# Patient Record
Sex: Female | Born: 2000 | Race: Black or African American | Hispanic: No | Marital: Single | Smoking: Never smoker
Health system: Southern US, Community
[De-identification: ages and names within clinical notes are randomized; demographics above are authoritative.]

## PROBLEM LIST (undated history)

## (undated) DIAGNOSIS — J358 Other chronic diseases of tonsils and adenoids: Secondary | ICD-10-CM

## (undated) DIAGNOSIS — J309 Allergic rhinitis, unspecified: Secondary | ICD-10-CM

## (undated) HISTORY — PX: DENTAL SURGERY: SHX609

---
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
## 2021-02-22 ENCOUNTER — Other Ambulatory Visit: Payer: Self-pay | Admitting: Certified Nurse Midwife

## 2021-02-23 ENCOUNTER — Other Ambulatory Visit: Payer: Self-pay | Admitting: Certified Nurse Midwife

## 2021-02-23 DIAGNOSIS — N63 Unspecified lump in unspecified breast: Secondary | ICD-10-CM

## 2021-03-08 ENCOUNTER — Other Ambulatory Visit: Payer: Self-pay

## 2021-03-08 ENCOUNTER — Emergency Department
Admission: EM | Admit: 2021-03-08 | Discharge: 2021-03-08 | Disposition: A | Discharge status: Home / Self Care | Payer: Medicaid Other | Attending: Emergency Medicine | Admitting: Emergency Medicine

## 2021-03-08 DIAGNOSIS — H5789 Other specified disorders of eye and adnexa: Secondary | ICD-10-CM | POA: Diagnosis present

## 2021-03-08 DIAGNOSIS — H1133 Conjunctival hemorrhage, bilateral: Secondary | ICD-10-CM | POA: Insufficient documentation

## 2021-03-08 LAB — POC URINE PREG, ED: Preg Test, Ur: NEGATIVE

## 2021-03-08 NOTE — ED Triage Notes (Signed)
Pt comes with c/o bilateral eye pain. Pt states pain and redness to eyes. Pt unsure if she got something in her eyes.

## 2021-03-08 NOTE — ED Provider Notes (Signed)
[facility]  ____________________________________________   Event Date/Time   First MD Initiated Contact with Patient 03/08/21 1240     (approximate)  I have reviewed the triage vital signs and the nursing notes.   HISTORY  Chief Complaint Eye Pain    HPI Vanessa Mathews is a 20 y.o. female with no significant past medical history who presents with concern for bleeding in her eyes.  Patient woke up today and noticed that there was blood in the whites of her eyes.  Notes that she had 3 episodes of emesis yesterday.  Has never had this before.  She denies visual change, pain or floaters, flashers.  Does not wear contacts.  Denies any drainage.  No fevers.  Denies any bleeding gums or blood in her stool or other bleeding etc.         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 Authorizing Provider  co-enzyme Q-10 30 MG capsule Take 30 mg by mouth 3 (three) times daily.    [provider]  Cranberry 1000 MG CAPS Take by mouth.    [provider]  HYDROcodone-acetaminophen (HYCET) 7.5-325 mg/15 ml solution 10-15cc PO every 6 hours as needed for pain 08/06/19   Geanie Logan, MD  hydrOXYzine (ATARAX/VISTARIL) 25 MG tablet TAKE 1 TABLET (25 MG TOTAL) BY MOUTH AT BEDTIME AS NEEDED FOR ANXIETY. 02/23/20   Darcel Smalling, MD  magnesium 30 MG tablet Take 30 mg by mouth 2 (two) times daily.    [provider]  NORTREL 7/7/7  0.5/0.75/1-35 MG-MCG tablet TAKE 1 TAB BY MOUTH ONCE A DAY TO BE STARTED THIS SUNDAY 12/03/17   [provider]  sertraline (ZOLOFT) 25 MG tablet TAKE 1 TABLET BY MOUTH EVERY DAY 04/14/20   Jomarie Longs, MD    Allergies Nickel  Family History  Problem Relation Age of Onset   Liver cancer Maternal Grandmother    Diabetes Maternal Grandfather    Breast cancer Other     Social History Social History   Tobacco Use   Smoking status: Never   Smokeless tobacco: Never  Vaping Use   Vaping Use: Never used  Substance Use Topics   Alcohol use: No   Drug use: No    Review of Systems   Review of Systems  Constitutional:  Negative for fever.  Eyes:  Positive for redness. Negative for pain, discharge, itching and visual disturbance.  Hematological:  Does not bruise/bleed easily.  All other systems reviewed and are negative.  Physical Exam Updated Vital Signs BP 128/80 (BP Location: Left Arm)   Pulse 77   Temp 98.8 F (37.1 C) (Oral)   Resp 17   Ht 5\' 3"  (1.6 m)   Wt 76.7 kg   SpO2 97%   BMI 29.95 kg/m   Physical Exam Vitals and nursing note reviewed.  Constitutional:      General: She is not in acute distress.    Appearance:  Normal appearance.  HENT:     Head: Normocephalic and atraumatic.  Eyes:     General: No scleral icterus.    Extraocular Movements: Extraocular movements intact.     Pupils: Pupils are equal, round, and reactive to light.     Comments: BL subconjunctival hemorrhage, limbic sparing, no hyphema, EOMI   Pulmonary:     Effort: Pulmonary effort is normal. No respiratory distress.     Breath sounds: No stridor.  Musculoskeletal:        General: No deformity or signs of injury.     Cervical back: Normal range of motion.  Skin:    General: Skin is dry.     Coloration: Skin is not jaundiced or pale.  Neurological:     General: No focal deficit present.     Mental Status: She is alert and oriented to person, place, and time. Mental status is  at baseline.  Psychiatric:        Mood and Affect: Mood normal.        Behavior: Behavior normal.     LABS (all labs ordered are listed, but only abnormal results are displayed)  Labs Reviewed  POC URINE PREG, ED  POC URINE PREG, ED   ____________________________________________  EKG  N/a ____________________________________________  RADIOLOGY Ky Barban, personally viewed and evaluated these images (plain radiographs) as part of my medical decision making, as well as reviewing the written report by the radiologist.  ED MD interpretation:  n/a    ____________________________________________   PROCEDURES  Procedure(s) performed (including Critical Care):  Procedures   ____________________________________________   INITIAL IMPRESSION / ASSESSMENT AND PLAN / ED COURSE   20 year old female who presents with bilateral cell conjunctival hemorrhage in the setting of emesis yesterday.  Exam notable for minimal subconjunctival hemorrhage bilaterally, no other abnormalities.  Will obtain urine  preg in the emesis.  She has no ongoing GI upset or emesis today.  Otherwise stable for discharge.   hCG is negative.     ____________________________________________   FINAL CLINICAL IMPRESSION(S) / ED DIAGNOSES  Final diagnoses:  Subconjunctival hemorrhage of both eyes     ED Discharge Orders     None        Note:  This document was prepared using Dragon voice recognition software and may include unintentional dictation errors.    Georga Hacking, MD 03/08/21 [phone]

## 2021-03-08 NOTE — ED Notes (Signed)
See triage note  Presents with irritation to both eyes  Right is worse

## 2021-05-20 ENCOUNTER — Ambulatory Visit
Admission: RE | Admit: 2021-05-20 | Discharge: 2021-05-20 | Disposition: A | Discharge status: Home / Self Care | Payer: Medicaid Other | Source: Ambulatory Visit | Attending: Certified Nurse Midwife | Admitting: Certified Nurse Midwife

## 2021-05-20 ENCOUNTER — Other Ambulatory Visit: Payer: Self-pay

## 2021-05-20 DIAGNOSIS — N63 Unspecified lump in unspecified breast: Secondary | ICD-10-CM

## 2022-07-04 IMAGING — US US BREAST*L* LIMITED INC AXILLA
1 series · 3 of 3 positions shown · non-contrast
Comparison: Previous exam(s).

CLINICAL DATA: Patient presents for diffuse bilateral breast pain
since the age of 15 which is intermittent. Patient states there is
more focal pain within the upper-outer left breast as well as
palpable nodularity.

EXAM:
ULTRASOUND OF THE LEFT BREAST

[Series 1: us breast*left* limited inc axilla · 0.06mm/px · 3 of 3 slices shown]
[im 1/3]
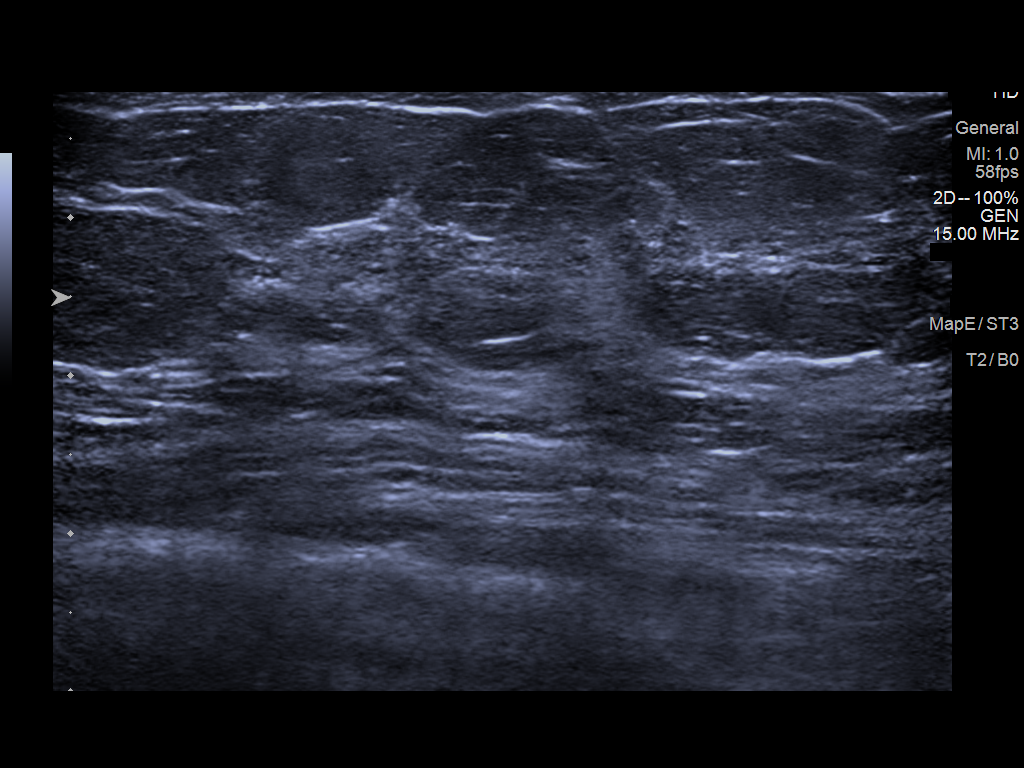
[im 2/3]
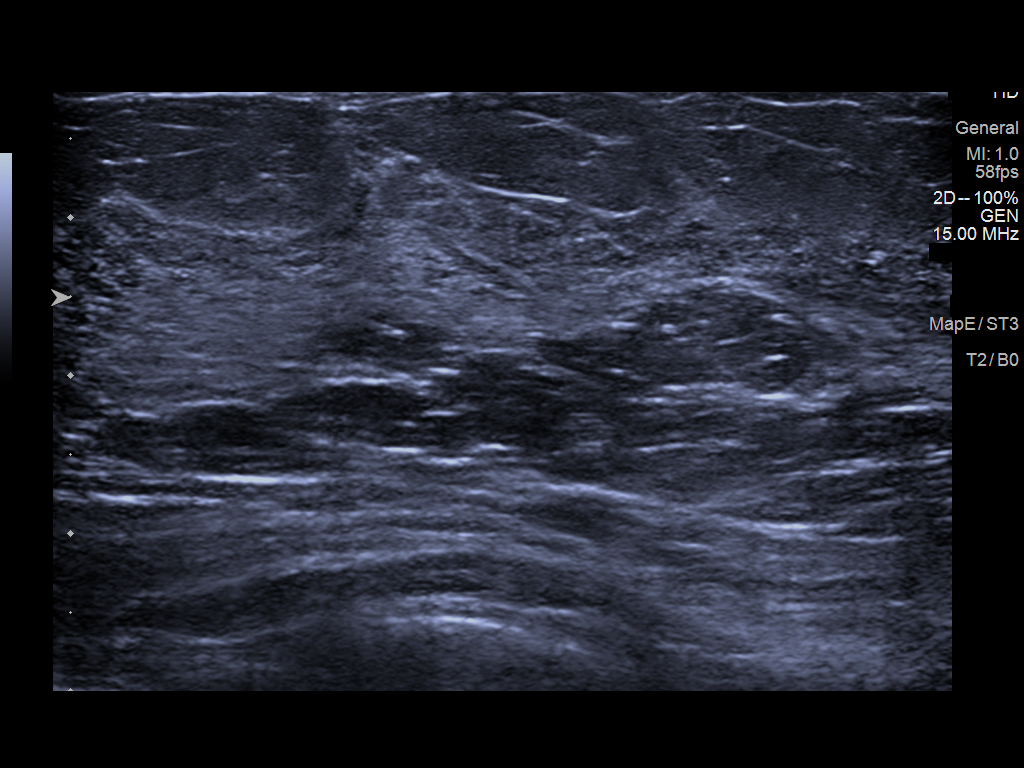
[im 3/3]
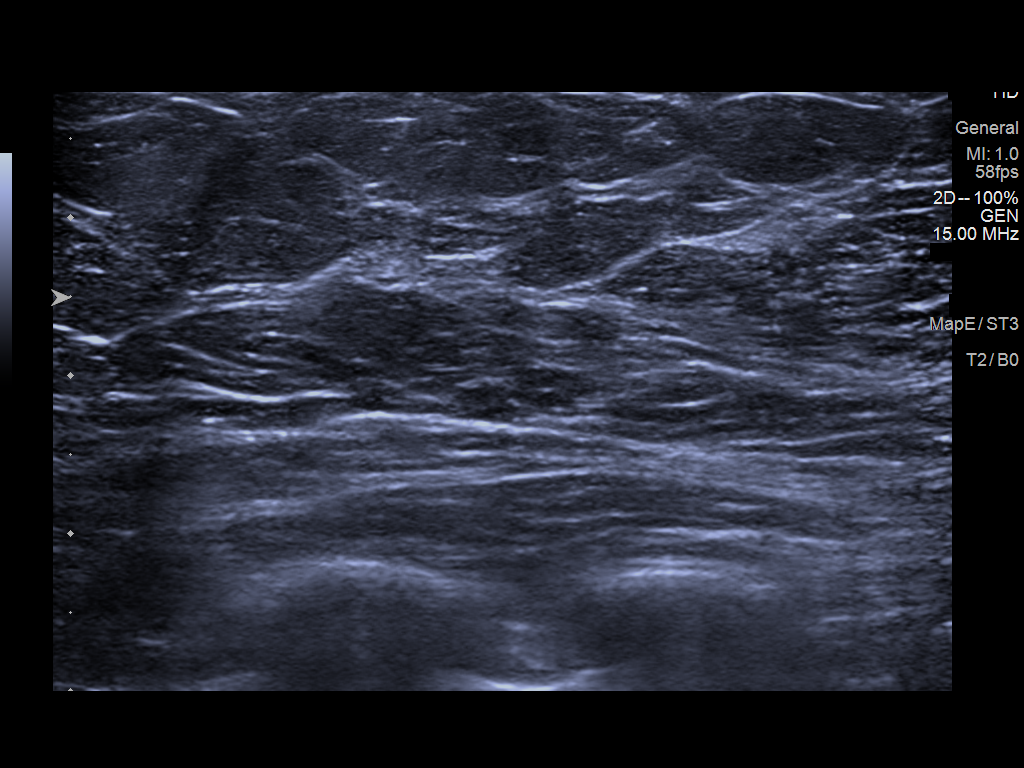

[3 of 3 positions shown; findings below may reference images not displayed]

FINDINGS: On physical exam, dense tissue is palpated within the upper-outer
left breast.

Targeted ultrasound is performed, showing normal tissue without
suspicious mass within the upper-outer left breast at the site of
focal tenderness and palpable nodularity.
IMPRESSION: No suspicious abnormality within the upper-outer left breast at the
site of focal tenderness and palpable nodularity.

RECOMMENDATION:
Continued clinical evaluation for pain within the upper-outer left
breast and palpable nodularity.

Screening mammogram at age 41 unless there are persistent or
intervening clinical concerns. (Code:K8-9-FQT)

I have discussed the findings and recommendations with the patient.
If applicable, a reminder letter will be sent to the patient
regarding the next appointment.

BI-RADS CATEGORY  1: Negative.

## 2022-08-22 ENCOUNTER — Other Ambulatory Visit
Admission: RE | Admit: 2022-08-22 | Discharge: 2022-08-22 | Disposition: A | Discharge status: Home / Self Care | Payer: Medicaid Other | Source: Ambulatory Visit | Attending: Student | Admitting: Student

## 2022-08-22 DIAGNOSIS — R1084 Generalized abdominal pain: Secondary | ICD-10-CM | POA: Diagnosis present

## 2022-08-22 LAB — LIPASE, BLOOD: Lipase: 37 U/L (ref 11–51)

## 2022-09-27 ENCOUNTER — Other Ambulatory Visit: Payer: Self-pay | Admitting: Student

## 2022-09-27 DIAGNOSIS — H538 Other visual disturbances: Secondary | ICD-10-CM

## 2022-09-27 DIAGNOSIS — R42 Dizziness and giddiness: Secondary | ICD-10-CM

## 2022-09-27 DIAGNOSIS — R519 Headache, unspecified: Secondary | ICD-10-CM

## 2022-10-04 ENCOUNTER — Ambulatory Visit
Admission: RE | Admit: 2022-10-04 | Discharge: 2022-10-04 | Disposition: A | Discharge status: Home / Self Care | Payer: Medicaid Other | Source: Ambulatory Visit | Attending: Student | Admitting: Student

## 2022-10-04 DIAGNOSIS — H538 Other visual disturbances: Secondary | ICD-10-CM | POA: Insufficient documentation

## 2022-10-04 DIAGNOSIS — R519 Headache, unspecified: Secondary | ICD-10-CM | POA: Diagnosis not present

## 2022-10-04 DIAGNOSIS — R42 Dizziness and giddiness: Secondary | ICD-10-CM | POA: Insufficient documentation

## 2023-05-28 ENCOUNTER — Emergency Department
Admission: EM | Admit: 2023-05-28 | Discharge: 2023-05-28 | Disposition: A | Discharge status: Home / Self Care | Payer: Medicaid Other | Attending: Emergency Medicine | Admitting: Emergency Medicine

## 2023-05-28 ENCOUNTER — Emergency Department: Payer: Medicaid Other

## 2023-05-28 ENCOUNTER — Other Ambulatory Visit: Payer: Self-pay

## 2023-05-28 DIAGNOSIS — Z20822 Contact with and (suspected) exposure to covid-19: Secondary | ICD-10-CM | POA: Diagnosis not present

## 2023-05-28 DIAGNOSIS — K529 Noninfective gastroenteritis and colitis, unspecified: Secondary | ICD-10-CM | POA: Diagnosis not present

## 2023-05-28 DIAGNOSIS — R112 Nausea with vomiting, unspecified: Secondary | ICD-10-CM | POA: Diagnosis present

## 2023-05-28 LAB — URINALYSIS, ROUTINE W REFLEX MICROSCOPIC
Bilirubin Urine: NEGATIVE
Glucose, UA: NEGATIVE mg/dL
Hgb urine dipstick: NEGATIVE
Ketones, ur: NEGATIVE mg/dL
Leukocytes,Ua: NEGATIVE
Nitrite: NEGATIVE
Protein, ur: 100 mg/dL — AB
Specific Gravity, Urine: 1.026 (ref 1.005–1.030)
pH: 7 (ref 5.0–8.0)

## 2023-05-28 LAB — RESP PANEL BY RT-PCR (RSV, FLU A&B, COVID)  RVPGX2
Influenza A by PCR: NEGATIVE
Influenza B by PCR: NEGATIVE
Resp Syncytial Virus by PCR: NEGATIVE
SARS Coronavirus 2 by RT PCR: NEGATIVE

## 2023-05-28 LAB — CBC
HCT: 41.3 % (ref 36.0–46.0)
Hemoglobin: 13.1 g/dL (ref 12.0–15.0)
MCH: 26.2 pg (ref 26.0–34.0)
MCHC: 31.7 g/dL (ref 30.0–36.0)
MCV: 82.6 fL (ref 80.0–100.0)
Platelets: 276 10*3/uL (ref 150–400)
RBC: 5 MIL/uL (ref 3.87–5.11)
RDW: 13 % (ref 11.5–15.5)
WBC: 10.1 10*3/uL (ref 4.0–10.5)
nRBC: 0 % (ref 0.0–0.2)

## 2023-05-28 LAB — COMPREHENSIVE METABOLIC PANEL
ALT: 20 U/L (ref 0–44)
AST: 28 U/L (ref 15–41)
Albumin: 4.3 g/dL (ref 3.5–5.0)
Alkaline Phosphatase: 47 U/L (ref 38–126)
Anion gap: 9 (ref 5–15)
BUN: 12 mg/dL (ref 6–20)
CO2: 22 mmol/L (ref 22–32)
Calcium: 9.3 mg/dL (ref 8.9–10.3)
Chloride: 104 mmol/L (ref 98–111)
Creatinine, Ser: 0.54 mg/dL (ref 0.44–1.00)
GFR, Estimated: >60 mL/min (ref >60)
Glucose, Bld: 115 mg/dL — ABNORMAL HIGH (ref 70–99)
Potassium: 4.3 mmol/L (ref 3.5–5.1)
Sodium: 135 mmol/L (ref 135–145)
Total Bilirubin: 0.5 mg/dL (ref <1.2)
Total Protein: 7.9 g/dL (ref 6.5–8.1)

## 2023-05-28 LAB — PREGNANCY, URINE: Preg Test, Ur: NEGATIVE

## 2023-05-28 LAB — LIPASE, BLOOD: Lipase: 25 U/L (ref 11–51)

## 2023-05-28 MED ORDER — IOHEXOL 300 MG/ML  SOLN
80.0000 mL | Freq: Once | INTRAMUSCULAR | Status: AC | PRN
  Administered 2023-05-28: 80 mL via INTRAVENOUS

## 2023-05-28 MED ORDER — METOCLOPRAMIDE HCL 5 MG/ML IJ SOLN
10.0000 mg | Freq: Once | INTRAMUSCULAR | Status: AC
  Administered 2023-05-28: 10 mg via INTRAVENOUS
  Filled 2023-05-28: qty 2

## 2023-05-28 MED ORDER — KETOROLAC TROMETHAMINE 30 MG/ML IJ SOLN
15.0000 mg | Freq: Once | INTRAMUSCULAR | Status: AC
  Administered 2023-05-28: 15 mg via INTRAVENOUS
  Filled 2023-05-28: qty 1

## 2023-05-28 MED ORDER — HYDROMORPHONE HCL 1 MG/ML IJ SOLN
1.0000 mg | Freq: Once | INTRAMUSCULAR | Status: AC
  Administered 2023-05-28: 1 mg via INTRAVENOUS
  Filled 2023-05-28: qty 1

## 2023-05-28 MED ORDER — SODIUM CHLORIDE 0.9 % IV BOLUS
1000.0000 mL | Freq: Once | INTRAVENOUS | Status: AC
  Administered 2023-05-28: 1000 mL via INTRAVENOUS

## 2023-05-28 MED ORDER — MORPHINE SULFATE (PF) 4 MG/ML IV SOLN
4.0000 mg | Freq: Once | INTRAVENOUS | Status: AC
  Administered 2023-05-28: 4 mg via INTRAVENOUS
  Filled 2023-05-28: qty 1

## 2023-05-28 MED ORDER — METRONIDAZOLE 500 MG PO TABS: 500.0000 mg | ORAL_TABLET | Freq: Two times a day (BID) | ORAL | 0 refills | Status: AC

## 2023-05-28 MED ORDER — DICYCLOMINE HCL 10 MG PO CAPS
10.0000 mg | ORAL_CAPSULE | Freq: Once | ORAL | Status: AC
  Administered 2023-05-28: 10 mg via ORAL
  Filled 2023-05-28: qty 1

## 2023-05-28 MED ORDER — ONDANSETRON 4 MG PO TBDP: 4.0000 mg | ORAL_TABLET | Freq: Three times a day (TID) | ORAL | 0 refills | Status: AC | PRN

## 2023-05-28 MED ORDER — DICYCLOMINE HCL 10 MG PO CAPS: 10.0000 mg | ORAL_CAPSULE | Freq: Three times a day (TID) | ORAL | 0 refills | Status: AC

## 2023-05-28 MED ORDER — ONDANSETRON HCL 4 MG/2ML IJ SOLN
4.0000 mg | Freq: Once | INTRAMUSCULAR | Status: AC
  Administered 2023-05-28: 4 mg via INTRAVENOUS
  Filled 2023-05-28: qty 2

## 2023-05-28 MED ORDER — AMOXICILLIN 875 MG PO TABS: 875.0000 mg | ORAL_TABLET | Freq: Two times a day (BID) | ORAL | 0 refills | Status: AC

## 2023-05-28 NOTE — ED Triage Notes (Addendum)
Pt comes belly pain, nausea and vomiting that started this morning. Pt appears pale and unable to keep anything down. Unable to get temp reading in triage.

## 2023-05-28 NOTE — ED Notes (Signed)
Patient returned from CT

## 2023-05-28 NOTE — ED Provider Notes (Signed)
[facility] Provider Note    Event Date/Time   First MD Initiated Contact with Patient 05/28/23 1316     (approximate)   History   Abdominal Pain   HPI  Vanessa Mathews is a 22 y.o. female with no significant past medical history presents emergency department complaining of abdominal pain nausea and vomiting that started this morning.  Patient states that she had 1 glass of wine last night.  Did not eat anything unusual.  States having some fever and chills.  No diarrhea had a normal bowel movement this morning.  Mother states that she is vomiting constantly.  States unsure what color of the vomit is.  Patient has not taken any nausea medicine this morning.  Does endorse marijuana use but has not used any in several days      Physical Exam   Triage Vital Signs: ED Triage Vitals  Encounter Vitals Group     BP 05/28/23 1309 (!) 141/93     Systolic BP Percentile --      Diastolic BP Percentile --      Pulse Rate 05/28/23 1309 (!) 52     Resp 05/28/23 1309 18     Temp 05/28/23 1320 97.6 F (36.4 C)     Temp Source 05/28/23 1320 Oral     SpO2 05/28/23 1309 96 %     Weight 05/28/23 1333 169 lb 1.5 oz (76.7 kg)     Height 05/28/23 1333 5\' 3"  (1.6 m)     Head Circumference --      Peak Flow --      Pain Score 05/28/23 1302 10     Pain Loc --      Pain Education --      Exclude from Growth Chart --     Most recent vital signs: Vitals:   05/28/23 1320 05/28/23 1739  BP:  122/78  Pulse:  62  Resp:  14  Temp: 97.6 F (36.4 C) 98.4 F (36.9 C)  SpO2:  100%     General: Awake, no distress.   CV:  Good peripheral perfusion. regular rate and  rhythm Resp:  Normal effort. Lungs cta Abd:  No distention.  Extremely tender right upper quadrant and midepigastric, bowel sounds normal Other:  Patient actively vomiting, appears to look pale   ED Results / Procedures / Treatments   Labs (all labs ordered are listed, but only abnormal results are  displayed) Labs Reviewed  COMPREHENSIVE METABOLIC PANEL - Abnormal; Notable for the following components:      Result Value   Glucose, Bld 115 (*)    All other components within normal limits  URINALYSIS, ROUTINE W REFLEX MICROSCOPIC - Abnormal; Notable for the following components:   Color, Urine YELLOW (*)    APPearance HAZY (*)    Protein, ur 100 (*)    Bacteria, UA RARE (*)    All other components within normal limits  RESP PANEL BY RT-PCR (RSV, FLU A&B, COVID)  RVPGX2  LIPASE, BLOOD  CBC  PREGNANCY, URINE     EKG     RADIOLOGY Ultrasound RUQ    PROCEDURES:   Procedures   MEDICATIONS ORDERED IN ED: Medications  sodium chloride 0.9 % bolus 1,000 mL (0 mLs Intravenous Stopped 05/28/23 1557)  ondansetron (ZOFRAN) injection 4 mg (4 mg Intravenous Given 05/28/23 1430)  morphine (PF) 4 MG/ML injection 4 mg (4 mg Intravenous Given 05/28/23 1431)  iohexol (OMNIPAQUE) 300 MG/ML solution 80 mL (80 mLs  Intravenous Contrast Given 05/28/23 1549)  HYDROmorphone (DILAUDID) injection 1 mg (1 mg Intravenous Given 05/28/23 1657)  metoCLOPramide (REGLAN) injection 10 mg (10 mg Intravenous Given 05/28/23 1656)  ketorolac (TORADOL) 30 MG/ML injection 15 mg (15 mg Intravenous Given 05/28/23 1726)  dicyclomine (BENTYL) capsule 10 mg (10 mg Oral Given 05/28/23 1726)     IMPRESSION / MDM / ASSESSMENT AND PLAN / ED COURSE  I reviewed the triage vital signs and the nursing notes.                              Differential diagnosis includes, but is not limited to, acute cholecystitis, pancreatitis, bowel obstruction, pyelonephritis, appendicitis, UTI, influenza, COVID, food poisoning  Patient's presentation is most consistent with acute illness / injury with system symptoms.   Patient appears to look dehydrated, we will go ahead and give normal saline 1 L IV, Zofran 4 mg IV, morphine 4 mg IV due to the discomfort  Once the patient is able to tolerate palpation of the abdomen we  will proceed with ultrasound RUQ  I recheck of the patient, she has tenderness more towards the umbilicus.  Therefore I will get a CT abdomen pelvis instead of a right upper quadrant ultrasound   I did dependently review and interpret the CT abdomen pelvis results.  I did review the images.  I do agree with the radiologist that this may be a mild colitis but otherwise negative.  I did explain these findings to the patient.  Patient's had additional pain medication.  She was given Dilaudid 1 mg IV, Toradol 15 mg IV, Bentyl p.o.  Feel that the pain should be under control at this point.  She is feeling better.  Explained to her that colitis is best treated with anti-inflammatories.  Do not think a narcotic is appropriate at this time.  She was given a prescription for Bentyl and Zofran.  She is to take over-the-counter Tylenol and ibuprofen.  Follow-up with GI if not improving in 2 to 3 days.  She was given an antibiotic to help treat the colitis, Flagyl and amoxicillin.  She was discharged in stable condition in the care of her mother.  FINAL CLINICAL IMPRESSION(S) / ED DIAGNOSES   Final diagnoses:  Colitis     Rx / DC Orders   ED Discharge Orders          Ordered    metroNIDAZOLE (FLAGYL) 500 MG tablet  2 times daily        05/28/23 1721    amoxicillin (AMOXIL) 875 MG tablet  2 times daily        05/28/23 1721    dicyclomine (BENTYL) 10 MG capsule  3 times daily before meals & bedtime        05/28/23 1721    ondansetron (ZOFRAN-ODT) 4 MG disintegrating tablet  Every 8 hours PRN        05/28/23 1721             Note:  This document was prepared using Dragon voice recognition software and may include unintentional dictation errors.    Faythe Ghee, PA-C 05/28/23 1749    Janith Lima, MD 05/28/23 [phone]

## 2023-05-28 NOTE — ED Notes (Signed)
See triage note  Presents with n/v and abd pain  States this started about 7am  Denies any fever  or diarrhea

## 2024-03-26 ENCOUNTER — Emergency Department
Admission: EM | Admit: 2024-03-26 | Discharge: 2024-03-26 | Disposition: A | Discharge status: Home / Self Care | Attending: Emergency Medicine | Admitting: Emergency Medicine

## 2024-03-26 ENCOUNTER — Telehealth: Payer: Self-pay | Admitting: Emergency Medicine

## 2024-03-26 ENCOUNTER — Other Ambulatory Visit: Payer: Self-pay

## 2024-03-26 ENCOUNTER — Emergency Department

## 2024-03-26 DIAGNOSIS — R112 Nausea with vomiting, unspecified: Secondary | ICD-10-CM | POA: Diagnosis present

## 2024-03-26 DIAGNOSIS — K529 Noninfective gastroenteritis and colitis, unspecified: Secondary | ICD-10-CM | POA: Diagnosis not present

## 2024-03-26 DIAGNOSIS — R1031 Right lower quadrant pain: Secondary | ICD-10-CM

## 2024-03-26 LAB — CBC
HCT: 46.4 % — ABNORMAL HIGH (ref 36.0–46.0)
Hemoglobin: 14.7 g/dL (ref 12.0–15.0)
MCH: 25.9 pg — ABNORMAL LOW (ref 26.0–34.0)
MCHC: 31.7 g/dL (ref 30.0–36.0)
MCV: 81.8 fL (ref 80.0–100.0)
Platelets: 277 K/uL (ref 150–400)
RBC: 5.67 MIL/uL — ABNORMAL HIGH (ref 3.87–5.11)
RDW: 12.9 % (ref 11.5–15.5)
WBC: 10.2 K/uL (ref 4.0–10.5)
nRBC: 0 % (ref 0.0–0.2)

## 2024-03-26 LAB — URINALYSIS, ROUTINE W REFLEX MICROSCOPIC
Bilirubin Urine: NEGATIVE
Glucose, UA: NEGATIVE mg/dL
Hgb urine dipstick: NEGATIVE
Ketones, ur: 20 mg/dL — AB
Leukocytes,Ua: NEGATIVE
Nitrite: NEGATIVE
Protein, ur: NEGATIVE mg/dL
Specific Gravity, Urine: >1.046 — ABNORMAL HIGH (ref 1.005–1.030)
pH: 7 (ref 5.0–8.0)

## 2024-03-26 LAB — RESP PANEL BY RT-PCR (RSV, FLU A&B, COVID)  RVPGX2
Influenza A by PCR: NEGATIVE
Influenza B by PCR: NEGATIVE
Resp Syncytial Virus by PCR: NEGATIVE
SARS Coronavirus 2 by RT PCR: NEGATIVE

## 2024-03-26 LAB — COMPREHENSIVE METABOLIC PANEL WITH GFR
ALT: 27 U/L (ref 0–44)
AST: 35 U/L (ref 15–41)
Albumin: 4.7 g/dL (ref 3.5–5.0)
Alkaline Phosphatase: 55 U/L (ref 38–126)
Anion gap: 15 (ref 5–15)
BUN: 10 mg/dL (ref 6–20)
CO2: 21 mmol/L — ABNORMAL LOW (ref 22–32)
Calcium: 9.9 mg/dL (ref 8.9–10.3)
Chloride: 103 mmol/L (ref 98–111)
Creatinine, Ser: 0.58 mg/dL (ref 0.44–1.00)
GFR, Estimated: >60 mL/min (ref >60)
Glucose, Bld: 126 mg/dL — ABNORMAL HIGH (ref 70–99)
Potassium: 3.5 mmol/L (ref 3.5–5.1)
Sodium: 139 mmol/L (ref 135–145)
Total Bilirubin: 0.6 mg/dL (ref 0.0–1.2)
Total Protein: 8.8 g/dL — ABNORMAL HIGH (ref 6.5–8.1)

## 2024-03-26 LAB — HCG, QUANTITATIVE, PREGNANCY: hCG, Beta Chain, Quant, S: <1 m[IU]/mL (ref <5)

## 2024-03-26 LAB — LIPASE, BLOOD: Lipase: 34 U/L (ref 11–51)

## 2024-03-26 MED ORDER — ONDANSETRON HCL 4 MG/2ML IJ SOLN
4.0000 mg | Freq: Once | INTRAMUSCULAR | Status: AC
  Administered 2024-03-26: 4 mg via INTRAVENOUS
  Filled 2024-03-26: qty 2

## 2024-03-26 MED ORDER — HYDROMORPHONE HCL 1 MG/ML IJ SOLN
1.0000 mg | Freq: Once | INTRAMUSCULAR | Status: DC
  Filled 2024-03-26: qty 1

## 2024-03-26 MED ORDER — METOCLOPRAMIDE HCL 5 MG/ML IJ SOLN
10.0000 mg | Freq: Once | INTRAMUSCULAR | Status: AC
  Administered 2024-03-26: 10 mg via INTRAVENOUS
  Filled 2024-03-26: qty 2

## 2024-03-26 MED ORDER — OXYCODONE HCL 5 MG PO TABS: 5.0000 mg | ORAL_TABLET | Freq: Three times a day (TID) | ORAL | 0 refills | Status: DC | PRN

## 2024-03-26 MED ORDER — IOHEXOL 300 MG/ML  SOLN
100.0000 mL | Freq: Once | INTRAMUSCULAR | Status: AC | PRN
  Administered 2024-03-26: 100 mL via INTRAVENOUS

## 2024-03-26 MED ORDER — PROMETHAZINE HCL 25 MG PO TABS: 25.0000 mg | ORAL_TABLET | Freq: Four times a day (QID) | ORAL | 0 refills | Status: AC | PRN

## 2024-03-26 MED ORDER — MORPHINE SULFATE (PF) 4 MG/ML IV SOLN
4.0000 mg | Freq: Once | INTRAVENOUS | Status: AC
  Administered 2024-03-26: 4 mg via INTRAVENOUS
  Filled 2024-03-26: qty 1

## 2024-03-26 MED ORDER — PIPERACILLIN-TAZOBACTAM 3.375 G IVPB 30 MIN
3.3750 g | Freq: Once | INTRAVENOUS | Status: AC
  Administered 2024-03-26: 3.375 g via INTRAVENOUS
  Filled 2024-03-26 (×2): qty 50

## 2024-03-26 MED ORDER — PROBIOTIC 1-250 BILLION-MG PO CAPS: 1.0000 | ORAL_CAPSULE | Freq: Every day | ORAL | 3 refills | Status: AC

## 2024-03-26 MED ORDER — AMOXICILLIN-POT CLAVULANATE 875-125 MG PO TABS: 1.0000 | ORAL_TABLET | Freq: Two times a day (BID) | ORAL | 0 refills | Status: AC

## 2024-03-26 MED ORDER — SODIUM CHLORIDE 0.9 % IV BOLUS (SEPSIS)
1000.0000 mL | Freq: Once | INTRAVENOUS | Status: AC
  Administered 2024-03-26: 1000 mL via INTRAVENOUS

## 2024-03-26 MED ORDER — OXYCODONE HCL 5 MG PO TABS: 5.0000 mg | ORAL_TABLET | Freq: Three times a day (TID) | ORAL | 0 refills | Status: AC | PRN

## 2024-03-26 NOTE — ED Triage Notes (Signed)
 Pt began vomiting and having abd pain after eating [place] food tonight, pt also reports chills denies cough or congestion.

## 2024-03-26 NOTE — Discharge Instructions (Addendum)
 You may alternate over the counter Tylenol  1000 mg every 6 hours as needed for pain, fever and Ibuprofen 800 mg every 6-8 hours as needed for pain, fever.  Please take Ibuprofen with food.  Do not take more than 4000 mg of Tylenol  (acetaminophen ) in a 24 hour period.  I recommend that you eat a bland diet for the next 2 to 3 months.  Please increase your fluid intake.  Please follow-up closely with your gastroenterologist.   You are being provided a prescription for opiates (also known as narcotics) for pain control.  Opiates can be addictive and should only be used when absolutely necessary for pain control when other alternatives do not work.  We recommend you only use them for the recommended amount of time and only as prescribed.  Please do not take with other sedative medications or alcohol.  Please do not drive, operate machinery, make important decisions while taking opiates.  Please note that these medications can be addictive and have high abuse potential.  Patients can become addicted to narcotics after only taking them for a few days.  Please keep these medications locked away from children, teenagers or any family members with history of substance abuse.  Narcotic pain medicine may also make you constipated.  You may use over-the-counter medications such as MiraLAX, Colace to prevent constipation.  If you become constipated, you may use over-the-counter enemas as needed.  Itching and nausea are also common side effects of narcotic pain medication.  If you develop uncontrolled vomiting or a rash, please stop these medications and seek medical care.

## 2024-03-26 NOTE — ED Notes (Signed)
 Pt tolerated PO challenge well. Pt urine sample obtained.

## 2024-03-26 NOTE — ED Provider Notes (Signed)
 [facility] Provider Note    Event Date/Time   First MD Initiated Contact with Patient 03/26/24 0340     (approximate)   History   Abdominal Pain   HPI  Vanessa Mathews is a 23 y.o. female with no significant past medical history who presents to the emergency department complaints of nausea, vomiting, generalized abdominal pain.  No diarrhea.  No dysuria, hematuria, vaginal bleeding or discharge.  No prior abdominal surgeries.  No known sick contacts.   History provided by patient, mother.    Past Medical History:  Diagnosis Date   Rhinitis, allergic    Tonsillolith     Past Surgical History:  Procedure Laterality Date   DENTAL SURGERY     TONSILLECTOMY Bilateral 08/06/2019   Procedure: TONSILLECTOMY;  Surgeon: Blair Mt, MD;  Location: [facility];  Service: ENT;  Laterality: Bilateral;  UPREG    MEDICATIONS:  Prior to Admission medications   Medication Sig Start Date End Date Taking? Authorizing Provider  amoxicillin  (AMOXIL ) 875 MG tablet Take 1 tablet (875 mg total) by mouth 2 (two) times daily. 05/28/23   Fisher, Devere ORN, PA-C  co-enzyme Q-10 30 MG capsule Take 30 mg by mouth 3 (three) times daily.    [provider]  Cranberry 1000 MG CAPS Take by mouth.    [provider]  dicyclomine  (BENTYL ) 10 MG capsule Take 1 capsule (10 mg total) by mouth 4 (four) times daily -  before meals and at bedtime. 05/28/23   Fisher, Devere ORN, PA-C  hydrOXYzine  (ATARAX /VISTARIL ) 25 MG tablet TAKE 1 TABLET (25 MG TOTAL) BY MOUTH AT BEDTIME AS NEEDED FOR ANXIETY. 02/23/20   Umrania, Hiren M, MD  magnesium 30 MG tablet Take 30 mg by mouth 2 (two) times daily.    [provider]  NORTREL 7/7/7 0.5/0.75/1-35 MG-MCG tablet TAKE 1 TAB BY MOUTH ONCE A DAY TO BE STARTED THIS SUNDAY 12/03/17   [provider]  ondansetron  (ZOFRAN -ODT) 4 MG disintegrating tablet Take 1 tablet (4 mg total) by mouth every 8 (eight) hours as  needed. 05/28/23   Gasper Devere ORN, PA-C  sertraline  (ZOLOFT ) 25 MG tablet TAKE 1 TABLET BY MOUTH EVERY DAY 04/14/20   Eappen, Saramma, MD    Physical Exam   Triage Vital Signs: ED Triage Vitals [03/26/24 0041]  Encounter Vitals Group     BP 139/78     Girls Systolic BP Percentile      Girls Diastolic BP Percentile      Boys Systolic BP Percentile      Boys Diastolic BP Percentile      Pulse Rate 84     Resp 19     Temp 98.4 F (36.9 C)     Temp Source Oral     SpO2 100 %     Weight 150 lb (68 kg)     Height 5' 4 (1.626 m)     Head Circumference      Peak Flow      Pain Score 8     Pain Loc      Pain Education      Exclude from Growth Chart     Most recent vital signs: Vitals:   03/26/24 0530 03/26/24 0636  BP:  126/81  Pulse: 85 97  Resp:  20  Temp:  98.4 F (36.9 C)  SpO2: 100% 96%    CONSTITUTIONAL: Alert, responds appropriately to questions. Well-appearing; well-nourished HEAD: Normocephalic, atraumatic, petechiae noted to her  face from forceful vomiting, no subconjunctival hemorrhage EYES: Conjunctivae clear, pupils appear equal, sclera nonicteric ENT: normal nose; moist mucous membranes NECK: Supple, normal ROM CARD: RRR; S1 and S2 appreciated RESP: Normal chest excursion without splinting or tachypnea; breath sounds clear and equal bilaterally; no wheezes, no rhonchi, no rales, no hypoxia or respiratory distress, speaking full sentences ABD/GI: Non-distended; soft, tender diffusely worse in the right lower quadrant without guarding or rebound BACK: The back appears normal EXT: Normal ROM in all joints; no deformity noted, no edema SKIN: Normal color for age and race; warm; no rash on exposed skin NEURO: Moves all extremities equally, normal speech PSYCH: The patient's mood and manner are appropriate.   ED Results / Procedures / Treatments   LABS: (all labs ordered are listed, but only abnormal results are displayed) Labs Reviewed  COMPREHENSIVE  METABOLIC PANEL WITH GFR - Abnormal; Notable for the following components:      Result Value   CO2 21 (*)    Glucose, Bld 126 (*)    Total Protein 8.8 (*)    All other components within normal limits  CBC - Abnormal; Notable for the following components:   RBC 5.67 (*)    HCT 46.4 (*)    MCH 25.9 (*)    All other components within normal limits  URINALYSIS, ROUTINE W REFLEX MICROSCOPIC - Abnormal; Notable for the following components:   Color, Urine YELLOW (*)    APPearance CLEAR (*)    Specific Gravity, Urine >1.046 (*)    Ketones, ur 20 (*)    All other components within normal limits  RESP PANEL BY RT-PCR (RSV, FLU A&B, COVID)  RVPGX2  LIPASE, BLOOD  HCG, QUANTITATIVE, PREGNANCY     EKG:  EKG Interpretation Date/Time:    Ventricular Rate:    PR Interval:    QRS Duration:    QT Interval:    QTC Calculation:   R Axis:      Text Interpretation:           RADIOLOGY: My personal review and interpretation of imaging: CT shows mild colitis.  I have personally reviewed all radiology reports.   CT ABDOMEN PELVIS W CONTRAST Result Date: 03/26/2024 CLINICAL DATA:  23 year old female with right abdominal pain, vomiting. EXAM: CT ABDOMEN AND PELVIS WITH CONTRAST TECHNIQUE: Multidetector CT imaging of the abdomen and pelvis was performed using the standard protocol following bolus administration of intravenous contrast. RADIATION DOSE REDUCTION: This exam was performed according to the departmental dose-optimization program which includes automated exposure control, adjustment of the mA and/or kV according to patient size and/or use of iterative reconstruction technique. CONTRAST:  OMNIPAQUE  IOHEXOL  300 MG/ML  SOLN COMPARISON:  CT Abdomen and Pelvis 05/28/2023. FINDINGS: Lower chest: Normal. Hepatobiliary: Negative liver and gallbladder. Pancreas: Negative. Spleen: Negative. Adrenals/Urinary Tract: Adrenal glands and kidneys appears symmetric and negative. Diminutive ureter.  Stomach/Bowel: Mildly redundant but decompressed large bowel in the pelvis. Decompressed descending colon, splenic flexure, and distal transverse colon also appear mildly indistinct such as on coronal image 38. Upstream transverse and right colon appear more normal. Cecum mostly located in the pelvis. Normal gas containing appendix on series 2, image 55. Nondilated small bowel throughout the abdomen and pelvis. Small volume retained fluid in the proximal stomach. Duodenum mostly decompressed. No pneumoperitoneum. No free fluid or mesenteric stranding identified. Umbilical piercing. Vascular/Lymphatic: Major arterial and portal venous structures in the abdomen and pelvis appear patent and normal. No lymphadenopathy identified. Reproductive: Retroverted uterus.  Within normal limits.  Other: No pelvis free fluid. Musculoskeletal: Chronic bilateral L5 pars fractures. Mild grade 1 spondylolisthesis L5-S1. Stable visualized osseous structures. IMPRESSION: 1. Normal appendix and no evidence of bowel obstruction. But decompressed distal transverse and descending colon appear mildly indistinct. Mild Acute Colitis not excluded. 2. 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 Electronically Signed   By: VEAR Hurst M.D.   On: 03/26/2024 06:16     PROCEDURES:  Critical Care performed: No     Procedures    IMPRESSION / MDM / ASSESSMENT AND PLAN / ED COURSE  I reviewed the triage vital signs and the nursing notes.    Patient here with nausea, vomiting, right lower quadrant abdominal pain.    DIFFERENTIAL DIAGNOSIS (includes but not limited to):   Viral gastroenteritis, dehydration, appendicitis, colitis, diverticulitis, UTI, kidney stone, pyelonephritis, ovarian torsion, ruptured ovarian cyst, ectopic pregnancy   Patient's presentation is most consistent with acute presentation with potential threat to life or bodily function.   PLAN: Labs obtained from triage show no  leukocytosis.  Normal electrolytes, creatinine, LFTs and lipase.  COVID, flu and RSV negative.  Urine, pregnancy test pending.  Will obtain CT of the abdomen pelvis to evaluate her appendix.  Will give IV fluids, pain and nausea medicine.  Has already received IV Zofran  without any relief.   MEDICATIONS GIVEN IN ED: Medications  ondansetron  (ZOFRAN ) injection 4 mg (4 mg Intravenous Given 03/26/24 0100)  sodium chloride  0.9 % bolus 1,000 mL (0 mLs Intravenous Stopped 03/26/24 0550)  metoCLOPramide  (REGLAN ) injection 10 mg (10 mg Intravenous Given 03/26/24 0359)  morphine  (PF) 4 MG/ML injection 4 mg (4 mg Intravenous Given 03/26/24 0403)  iohexol  (OMNIPAQUE ) 300 MG/ML solution 100 mL (100 mLs Intravenous Contrast Given 03/26/24 0536)  piperacillin -tazobactam (ZOSYN ) IVPB 3.375 g (0 g Intravenous Stopped 03/26/24 0651)     ED COURSE: CT scan reviewed and interpreted by myself and the radiologist and shows possible mild colitis.  She has had this previously and has been seen by GI but mother reports did not have any further testing including colonoscopy or endoscopy done.  Recommended continued follow-up with her gastroenterologist, bland diet for the next several days and will discharge with antibiotics to cover for infectious colitis.  No family history or personal history of inflammatory bowel disease.  No bloody stools or melena.   Urine does not appear infected.  Patient tolerating p.o.  Will discharge home with Augmentin , pain and nausea medicine and outpatient follow-up.   At this time, I do not feel there is any life-threatening condition present. I reviewed all nursing notes, vitals, pertinent previous records.  All lab and urine results, EKGs, imaging ordered have been independently reviewed and interpreted by myself.  I reviewed all available radiology reports from any imaging ordered this visit.  Based on my assessment, I feel the patient is safe to be discharged home without further emergent workup  and can continue workup as an outpatient as needed. Discussed all findings, treatment plan as well as usual and customary return precautions.  They verbalize understanding and are comfortable with this plan.  Outpatient follow-up has been provided as needed.  All questions have been answered.   CONSULTS: Admission considered but patient is feeling much better, tolerating p.o. and would like discharge home for outpatient follow-up.   OUTSIDE RECORDS REVIEWED: Reviewed previous GI notes in February 2025.       FINAL CLINICAL IMPRESSION(S) / ED DIAGNOSES   Final diagnoses:  RLQ abdominal pain  Nausea  and vomiting in adult  Colitis     Rx / DC Orders   ED Discharge Orders          Ordered    Ambulatory Referral to Primary Care (Establish Care)        03/26/24 0657    Bacillus Coagulans-Inulin (PROBIOTIC) 1-250 BILLION-MG CAPS  Daily        03/26/24 0658    oxyCODONE  (ROXICODONE ) 5 MG immediate release tablet  Every 8 hours PRN        03/26/24 0658    promethazine  (PHENERGAN ) 25 MG tablet  Every 6 hours PRN        03/26/24 0658    amoxicillin -clavulanate (AUGMENTIN ) 875-125 MG tablet  2 times daily        03/26/24 9277             Note:  This document was prepared using Dragon voice recognition software and may include unintentional dictation errors.   Sunny Aguon, Josette SAILOR, DO 03/26/24 [phone]

## 2024-03-26 NOTE — Telephone Encounter (Signed)
 Oxycodone  was not available at [facility], calling into [facility] on [address]  Almarie Elder, RN notified [address] and [employer]

## 2024-03-26 NOTE — ED Notes (Signed)
 Pt given saltine crackers and ginger ale for PO trial. Pt also made aware of continued need for urine sample.

## 2024-05-23 ENCOUNTER — Other Ambulatory Visit: Payer: Self-pay | Admitting: Gastroenterology

## 2024-05-23 DIAGNOSIS — R1013 Epigastric pain: Secondary | ICD-10-CM

## 2024-05-23 DIAGNOSIS — R112 Nausea with vomiting, unspecified: Secondary | ICD-10-CM

## 2024-05-24 ENCOUNTER — Other Ambulatory Visit: Payer: Self-pay | Admitting: Gastroenterology

## 2024-05-24 DIAGNOSIS — R1013 Epigastric pain: Secondary | ICD-10-CM

## 2024-05-24 DIAGNOSIS — R112 Nausea with vomiting, unspecified: Secondary | ICD-10-CM

## 2024-05-28 ENCOUNTER — Other Ambulatory Visit

## 2024-06-03 ENCOUNTER — Other Ambulatory Visit
Admission: RE | Admit: 2024-06-03 | Discharge: 2024-06-03 | Disposition: A | Discharge status: Home / Self Care | Source: Home / Self Care | Attending: Gastroenterology | Admitting: Gastroenterology

## 2024-06-03 ENCOUNTER — Encounter
Admission: RE | Admit: 2024-06-03 | Discharge: 2024-06-03 | Disposition: A | Discharge status: Home / Self Care | Source: Ambulatory Visit | Attending: Gastroenterology | Admitting: Gastroenterology

## 2024-06-03 ENCOUNTER — Ambulatory Visit
Admission: RE | Admit: 2024-06-03 | Discharge: 2024-06-03 | Disposition: A | Discharge status: Home / Self Care | Source: Ambulatory Visit | Attending: Gastroenterology | Admitting: Gastroenterology

## 2024-06-03 DIAGNOSIS — R112 Nausea with vomiting, unspecified: Secondary | ICD-10-CM

## 2024-06-03 DIAGNOSIS — R1013 Epigastric pain: Secondary | ICD-10-CM

## 2024-06-03 LAB — PREGNANCY, URINE: Preg Test, Ur: NEGATIVE

## 2024-06-03 MED ORDER — TECHNETIUM TC 99M MEBROFENIN IV KIT
5.0000 | PACK | Freq: Once | INTRAVENOUS | Status: AC | PRN
  Administered 2024-06-03: 5.12 via INTRAVENOUS
